# Patient Record
Sex: Female | Born: 1960 | Race: White | Hispanic: No | Smoking: Former smoker
Health system: Southern US, Community
[De-identification: ages and names within clinical notes are randomized; demographics above are authoritative.]

## PROBLEM LIST (undated history)

## (undated) ENCOUNTER — Emergency Department (HOSPITAL_COMMUNITY): Payer: BLUE CROSS/BLUE SHIELD | Source: Home / Self Care

## (undated) HISTORY — PX: AUGMENTATION MAMMAPLASTY: SUR837

---

## 2016-09-07 ENCOUNTER — Ambulatory Visit (INDEPENDENT_AMBULATORY_CARE_PROVIDER_SITE_OTHER): Payer: Self-pay | Admitting: Nurse Practitioner

## 2016-09-07 ENCOUNTER — Encounter: Payer: Self-pay | Admitting: Nurse Practitioner

## 2016-09-07 VITALS — BP 100/68 | HR 78 | Temp 97.9°F | Ht 64.0 in | Wt 165.8 lb

## 2016-09-07 DIAGNOSIS — J209 Acute bronchitis, unspecified: Secondary | ICD-10-CM

## 2016-09-07 MED ORDER — HYDROCODONE-HOMATROPINE 5-1.5 MG/5ML PO SYRP
5.0000 mL | ORAL_SOLUTION | Freq: Four times a day (QID) | ORAL | 0 refills | Status: DC | PRN
Start: 2016-09-07 — End: 2017-12-31

## 2016-09-07 MED ORDER — PREDNISONE 20 MG PO TABS: ORAL_TABLET | ORAL | 0 refills | Status: DC

## 2016-09-07 MED ORDER — LEVOFLOXACIN 500 MG PO TABS: 500.0000 mg | ORAL_TABLET | Freq: Every day | ORAL | 0 refills | Status: AC

## 2016-09-07 NOTE — Progress Notes (Signed)
Subjective:     Mary Gardner is a 56 y.o. female here for evaluation of a cough. Onset of symptoms was 1 week ago. Symptoms have been unchanged since that time. The cough is harsh and productive of green sputum and is aggravated by  worse at noght. Associated symptoms include: change in voice, sputum production and headache. Patient does not have a history of asthma. Patient does not have a history of environmental allergens. Patient has not traveled recently. Patient does not have a history of smoking. Patient has had a previous chest x-ray. Patient has not had a PPD done.  The following portions of the patient's history were reviewed and updated as appropriate: allergies, current medications, past family history, past medical history, past social history, past surgical history and problem list.  Review of Systems Pertinent items noted in HPI and remainder of comprehensive ROS otherwise negative.    Objective:     BP 100/68   Pulse 78   Temp 97.9 F (36.6 C)   Wt 165 lb 12.8 oz (75.2 kg)   SpO2 98%  General appearance: alert, cooperative and mild distress Eyes: conjunctivae/corneas clear. PERRL, EOM's intact. Fundi benign. Ears: normal TM's and external ear canals both ears Nose: green discharge, moderate congestion, turbinates red Throat: lips, mucosa, and tongue normal; teeth and gums normal Neck: no adenopathy, no carotid bruit, no JVD, supple, symmetrical, trachea midline and thyroid not enlarged, symmetric, no tenderness/mass/nodules Lungs: rhonchi bibasilar and posterior - bilateral and deep cough Heart: regular rate and rhythm, S1, S2 normal, no murmur, click, rub or gallop    Assessment:    Acute Bronchitis    Plan:   1. Acute bronchitis, unspecified organism    Meds ordered this encounter  Medications  . levofloxacin (LEVAQUIN) 500 MG tablet    Sig: Take 1 tablet (500 mg total) by mouth daily.    Dispense:  10 tablet    Refill:  0    Order Specific Question:    Supervising Provider    Answer:   Stacie GlazeJENKINS, JOHN E [[id number]]  . HYDROcodone-homatropine (HYCODAN) 5-1.5 MG/5ML syrup    Sig: Take 5 mLs by mouth every 6 (six) hours as needed for cough.    Dispense:  120 mL    Refill:  0    Order Specific Question:   Supervising Provider    Answer:   Stacie GlazeJENKINS, JOHN E [[id number]]  . predniSONE (DELTASONE) 20 MG tablet    Sig: 2 po at sametime daily for 5 days    Dispense:  10 tablet    Refill:  0    Order Specific Question:   Supervising Provider    Answer:   Darryll CapersJENKINS, JOHN E [[id number]]   1. Take meds as prescribed 2. Use a cool mist humidifier especially during the winter months and when heat has been humid. 3. Use saline nose sprays frequently 4. Saline irrigations of the nose can be very helpful if done frequently.  * 4X daily for 1 week*  * Use of a nettie pot can be helpful with this. Follow directions with this* 5. Drink plenty of fluids 6. Keep thermostat turn down low 7.For any cough or congestion  Use plain Mucinex- regular strength or max strength is fine   * Children- consult with Pharmacist for dosing 8. For fever or aces or pains- take tylenol or ibuprofen appropriate for age and weight.  * for fevers greater than 101 orally you may alternate ibuprofen and tylenol every  3 hours.  Mary-Margaret Hassell Done, FNP

## 2016-09-07 NOTE — Patient Instructions (Signed)

## 2017-01-02 ENCOUNTER — Other Ambulatory Visit: Payer: Self-pay | Admitting: Family Medicine

## 2017-01-05 ENCOUNTER — Other Ambulatory Visit: Payer: Self-pay | Admitting: Family Medicine

## 2017-01-05 DIAGNOSIS — Z1231 Encounter for screening mammogram for malignant neoplasm of breast: Secondary | ICD-10-CM

## 2017-01-14 ENCOUNTER — Other Ambulatory Visit: Payer: Self-pay | Admitting: Obstetrics and Gynecology

## 2017-01-14 DIAGNOSIS — Z1231 Encounter for screening mammogram for malignant neoplasm of breast: Secondary | ICD-10-CM

## 2017-01-29 ENCOUNTER — Ambulatory Visit (HOSPITAL_COMMUNITY)
Admission: RE | Admit: 2017-01-29 | Discharge: 2017-01-29 | Disposition: A | Discharge status: Home / Self Care | Payer: Self-pay | Source: Ambulatory Visit | Attending: Obstetrics and Gynecology | Admitting: Obstetrics and Gynecology

## 2017-01-29 ENCOUNTER — Other Ambulatory Visit: Payer: Self-pay | Admitting: Obstetrics and Gynecology

## 2017-01-29 ENCOUNTER — Encounter (HOSPITAL_COMMUNITY): Payer: Self-pay

## 2017-01-29 ENCOUNTER — Encounter: Payer: Self-pay | Admitting: Radiology

## 2017-01-29 ENCOUNTER — Ambulatory Visit
Admission: RE | Admit: 2017-01-29 | Discharge: 2017-01-29 | Disposition: A | Discharge status: Home / Self Care | Payer: No Typology Code available for payment source | Source: Ambulatory Visit | Attending: Obstetrics and Gynecology | Admitting: Obstetrics and Gynecology

## 2017-01-29 VITALS — BP 135/85 | HR 66 | Temp 98.1°F | Ht 64.0 in | Wt 165.5 lb

## 2017-01-29 DIAGNOSIS — Z1231 Encounter for screening mammogram for malignant neoplasm of breast: Secondary | ICD-10-CM

## 2017-01-29 DIAGNOSIS — Z1239 Encounter for other screening for malignant neoplasm of breast: Secondary | ICD-10-CM

## 2017-01-29 NOTE — Progress Notes (Signed)
No complaints today.   Pap Smear: Pap smear not completed today. Last Pap smear was in 2008 and normal per patient. Per patient has no history of an abnormal Pap smear. Patient has a history of a hysterectomy in 2008 for fibroids and painful cramps. Patient no longer needs Pap smears due to her history of a hysterectomy for benign reasons. No Pap smear results are in EPIC.  Physical exam: Breasts Breasts symmetrical. No skin abnormalities bilateral breasts. No nipple retraction bilateral breasts. No nipple discharge bilateral breasts. No lymphadenopathy. No lumps palpated bilateral breasts. No complaints of pain or tenderness on exam. [facility]. Appointment scheduled for Thursday, January 29, 2017 at 1600.        Pelvic/Bimanual No Pap smear completed today since patient has a history of a hysterectomy for benign reasons. Pap smear not indicated per [facility] guidelines.   Smoking History: Patient is a former smoker that quit in the early 1980's.  Patient Navigation: Patient education provided. Access to services provided for patient through [facility] program.   Colorectal Cancer Screening: Per patient has never had a colonoscopy completed. No complaints today. FIT Test given to patient to complete and return to [facility].

## 2017-01-29 NOTE — Patient Instructions (Signed)
Explained breast self awareness with Eloise LevelsAlecia Goers. Patient did not need a Pap smear today due to her history of a hysterectomy for benign reasons. Let patient know that she doesn't need any further Pap smears due to her history of a hysterectomy for benign reasons. [facility]. Appointment scheduled for Thursday, January 29, 2017 at 1600. Let patient know the Breast Center will follow up with her within the next couple weeks with results of mammogram by letter or phone. Gladstone LighterAlecia Jividen verbalized understanding.  Sallyann Kinnaird, Kathaleen Maserhristine Poll, RN 3:43 PM

## 2017-02-02 ENCOUNTER — Ambulatory Visit: Payer: Self-pay

## 2017-02-03 ENCOUNTER — Encounter (HOSPITAL_COMMUNITY): Payer: Self-pay

## 2017-05-28 ENCOUNTER — Ambulatory Visit: Payer: Self-pay | Admitting: Nurse Practitioner

## 2017-05-28 ENCOUNTER — Encounter: Payer: Self-pay | Admitting: Nurse Practitioner

## 2017-05-28 VITALS — BP 104/72 | HR 82 | Temp 98.4°F | Resp 18 | Wt 169.0 lb

## 2017-05-28 DIAGNOSIS — J209 Acute bronchitis, unspecified: Secondary | ICD-10-CM

## 2017-05-28 MED ORDER — BENZONATATE 100 MG PO CAPS: 100.0000 mg | ORAL_CAPSULE | Freq: Three times a day (TID) | ORAL | 0 refills | Status: AC | PRN

## 2017-05-28 MED ORDER — HYDROCODONE-HOMATROPINE 5-1.5 MG/5ML PO SYRP: 5.0000 mL | ORAL_SOLUTION | Freq: Three times a day (TID) | ORAL | 0 refills | Status: DC | PRN

## 2017-05-28 MED ORDER — PREDNISONE 10 MG (21) PO TBPK: ORAL_TABLET | ORAL | 0 refills | Status: AC

## 2017-05-28 MED ORDER — LEVOFLOXACIN 500 MG PO TABS: 500.0000 mg | ORAL_TABLET | Freq: Every day | ORAL | 0 refills | Status: AC

## 2017-05-28 NOTE — Patient Instructions (Addendum)

## 2017-05-28 NOTE — Progress Notes (Signed)
Subjective:     Mary Gardner is a 56 y.o. female here for evaluation of a cough. Onset of symptoms was 3 days ago. Symptoms have been gradually worsening since that time. The cough is painful and productive of clear sputum and is aggravated by nothing. Associated symptoms include: chest congestion. 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 Patient does have a history of environmental allergens. Patient has not traveled recently. Patient does have a history of smoking marijuana.    Patient states she had the same symptoms earlier this year and the medication regimen prescribed worked for her symptoms.    The following portions of the patient's history were reviewed and updated as appropriate: allergies, current medications and past medical history.  Review of Systems Constitutional: negative Eyes: negative Ears, nose, mouth, throat, and face: negative Respiratory: positive for cough, sputum and bronchitis, negative for hemoptysis Cardiovascular: negative Neurological: positive for headaches Allergic/Immunologic: positive for hay fever    Objective:     BP 104/72 (BP Location: Right Arm, Patient Position: Sitting, Cuff Size: Normal)   Pulse 82   Temp 98.4 F (36.9 C) (Oral)   Resp 18   Wt 169 lb (76.7 kg)   SpO2 100%   BMI 29.01 kg/m  General appearance: alert, cooperative, mild distress and due to excessive coughing Head: Normocephalic, without obvious abnormality, atraumatic Eyes: conjunctivae/corneas clear. PERRL, EOM's intact. Fundi benign. Ears: normal TM's and external ear canals both ears Nose: Nares normal. Septum midline. Mucosa normal. No drainage or sinus tenderness. Throat: lips, mucosa, and tongue normal; teeth and gums normal Lungs: rhonchi posterior - bilateral Heart: regular rate and rhythm, S1, S2 normal, no murmur, click, rub or gallop Pulses: 2+ and symmetric Skin: Skin color, texture, turgor  normal. No rashes or lesions Lymph nodes: Cervical, supraclavicular, and axillary nodes normal. Neurologic: Grossly normal    Assessment:    Acute Bronchitis    Plan:      Antitussives and antibiotic per medication orders.   Sterapred as directed.  Tessalon perles for cough during the day.  Hycodan syrup for more severe cough.  Avoid exposure to tobacco smoke and fumes. Patient instructed to use a humidifier at home and to avoid cool, dry air.  Patient also instructed to use honey as needed for cough Call if shortness of breath worsens, blood in sputum, change in character of cough, development of fever or chills, inability to maintain nutrition and hydration. Avoid exposure to tobacco smoke and fumes.  Reviewed with patient instruction to go to ER if chest pain changes in nature, SOB, difficulty breathing, pain radiating down arm or onset of diaphoresis.  Patient verbalized understanding.

## 2017-06-02 ENCOUNTER — Telehealth: Payer: Self-pay | Admitting: Nurse Practitioner

## 2017-06-02 NOTE — Telephone Encounter (Signed)
Called patient to follow up on her status.  Unable to leave voicemail.

## 2017-06-10 ENCOUNTER — Ambulatory Visit: Payer: Self-pay | Admitting: Emergency Medicine

## 2017-06-10 ENCOUNTER — Encounter: Payer: Self-pay | Admitting: Emergency Medicine

## 2017-06-10 DIAGNOSIS — J0120 Acute ethmoidal sinusitis, unspecified: Secondary | ICD-10-CM

## 2017-06-10 DIAGNOSIS — T148XXA Other injury of unspecified body region, initial encounter: Secondary | ICD-10-CM

## 2017-06-10 MED ORDER — METHOCARBAMOL 500 MG PO TABS: 500.0000 mg | ORAL_TABLET | Freq: Four times a day (QID) | ORAL | 0 refills | Status: DC | PRN

## 2017-06-10 MED ORDER — AMOXICILLIN 500 MG PO CAPS: 1000.0000 mg | ORAL_CAPSULE | Freq: Two times a day (BID) | ORAL | 0 refills | Status: AC

## 2017-06-10 MED ORDER — FLUTICASONE PROPIONATE 50 MCG/ACT NA SUSP: 2.0000 | Freq: Two times a day (BID) | NASAL | 0 refills | Status: DC

## 2017-06-10 MED ORDER — DICLOFENAC SODIUM 75 MG PO TBEC: 75.0000 mg | DELAYED_RELEASE_TABLET | Freq: Two times a day (BID) | ORAL | 0 refills | Status: DC
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
## 2017-06-25 DIAGNOSIS — M79645 Pain in left finger(s): Secondary | ICD-10-CM | POA: Diagnosis not present

## 2017-06-25 DIAGNOSIS — S63633A Sprain of interphalangeal joint of left middle finger, initial encounter: Secondary | ICD-10-CM | POA: Diagnosis not present

## 2017-06-25 DIAGNOSIS — M13842 Other specified arthritis, left hand: Secondary | ICD-10-CM | POA: Diagnosis not present

## 2017-07-15 DIAGNOSIS — F12180 Cannabis abuse with cannabis-induced anxiety disorder: Secondary | ICD-10-CM | POA: Diagnosis not present

## 2017-07-15 DIAGNOSIS — R51 Headache: Secondary | ICD-10-CM | POA: Diagnosis not present

## 2017-07-15 DIAGNOSIS — R1013 Epigastric pain: Secondary | ICD-10-CM | POA: Diagnosis not present

## 2017-07-15 DIAGNOSIS — F3340 Major depressive disorder, recurrent, in remission, unspecified: Secondary | ICD-10-CM | POA: Diagnosis not present

## 2017-07-20 ENCOUNTER — Other Ambulatory Visit: Payer: Self-pay | Admitting: Family Medicine

## 2017-07-20 DIAGNOSIS — R519 Headache, unspecified: Secondary | ICD-10-CM

## 2017-07-20 DIAGNOSIS — R51 Headache: Principal | ICD-10-CM

## 2017-07-27 DIAGNOSIS — S63633D Sprain of interphalangeal joint of left middle finger, subsequent encounter: Secondary | ICD-10-CM | POA: Diagnosis not present

## 2017-07-27 DIAGNOSIS — M13842 Other specified arthritis, left hand: Secondary | ICD-10-CM | POA: Diagnosis not present

## 2017-07-27 DIAGNOSIS — M79642 Pain in left hand: Secondary | ICD-10-CM | POA: Diagnosis not present

## 2017-08-03 ENCOUNTER — Ambulatory Visit
Admission: RE | Admit: 2017-08-03 | Discharge: 2017-08-03 | Disposition: A | Discharge status: Home / Self Care | Payer: BLUE CROSS/BLUE SHIELD | Source: Ambulatory Visit | Attending: Family Medicine | Admitting: Family Medicine

## 2017-08-03 DIAGNOSIS — R51 Headache: Secondary | ICD-10-CM | POA: Diagnosis not present

## 2017-08-03 DIAGNOSIS — R519 Headache, unspecified: Secondary | ICD-10-CM

## 2017-08-04 DIAGNOSIS — N951 Menopausal and female climacteric states: Secondary | ICD-10-CM | POA: Diagnosis not present

## 2017-08-04 DIAGNOSIS — F324 Major depressive disorder, single episode, in partial remission: Secondary | ICD-10-CM | POA: Diagnosis not present

## 2017-08-04 DIAGNOSIS — Z0000 Encounter for general adult medical examination without abnormal findings: Secondary | ICD-10-CM | POA: Diagnosis not present

## 2017-08-04 DIAGNOSIS — E039 Hypothyroidism, unspecified: Secondary | ICD-10-CM | POA: Diagnosis not present

## 2017-08-17 DIAGNOSIS — Z01818 Encounter for other preprocedural examination: Secondary | ICD-10-CM | POA: Diagnosis not present

## 2017-08-17 DIAGNOSIS — Z1211 Encounter for screening for malignant neoplasm of colon: Secondary | ICD-10-CM | POA: Diagnosis not present

## 2017-08-31 DIAGNOSIS — H25813 Combined forms of age-related cataract, bilateral: Secondary | ICD-10-CM | POA: Diagnosis not present

## 2017-09-15 DIAGNOSIS — R51 Headache: Secondary | ICD-10-CM | POA: Diagnosis not present

## 2017-10-12 DIAGNOSIS — F3340 Major depressive disorder, recurrent, in remission, unspecified: Secondary | ICD-10-CM | POA: Diagnosis not present

## 2017-10-12 DIAGNOSIS — F12180 Cannabis abuse with cannabis-induced anxiety disorder: Secondary | ICD-10-CM | POA: Diagnosis not present

## 2017-10-26 DIAGNOSIS — D126 Benign neoplasm of colon, unspecified: Secondary | ICD-10-CM | POA: Diagnosis not present

## 2017-10-26 DIAGNOSIS — Z1211 Encounter for screening for malignant neoplasm of colon: Secondary | ICD-10-CM | POA: Diagnosis not present

## 2017-10-28 DIAGNOSIS — D126 Benign neoplasm of colon, unspecified: Secondary | ICD-10-CM | POA: Diagnosis not present

## 2017-10-28 DIAGNOSIS — Z1211 Encounter for screening for malignant neoplasm of colon: Secondary | ICD-10-CM | POA: Diagnosis not present

## 2017-11-23 ENCOUNTER — Ambulatory Visit: Payer: BLUE CROSS/BLUE SHIELD | Admitting: Neurology

## 2017-12-31 ENCOUNTER — Encounter: Payer: Self-pay | Admitting: Podiatry

## 2017-12-31 ENCOUNTER — Ambulatory Visit: Payer: BLUE CROSS/BLUE SHIELD | Admitting: Podiatry

## 2017-12-31 ENCOUNTER — Ambulatory Visit: Payer: Self-pay

## 2017-12-31 VITALS — BP 108/61 | HR 68 | Temp 98.6°F | Ht 64.0 in | Wt 175.0 lb

## 2017-12-31 DIAGNOSIS — M79672 Pain in left foot: Secondary | ICD-10-CM | POA: Diagnosis not present

## 2017-12-31 DIAGNOSIS — B070 Plantar wart: Secondary | ICD-10-CM | POA: Diagnosis not present

## 2017-12-31 NOTE — Progress Notes (Signed)
   Subjective:    Patient ID: Mary Gardner, female    DOB: 1960/07/07, 57 y.o.   MRN: [id number]  HPI    Review of Systems  All other systems reviewed and are negative.      Objective:   Physical Exam        Assessment & Plan:

## 2018-01-04 NOTE — Progress Notes (Signed)
Subjective:   Patient ID: Mary Gardner, female   DOB: 57 y.o.   MRN: [id number]   HPI Patient presents stating I have got this painful spot on the bottom of my left foot that is been present for several months and I do not remember stepping on anything or any bleeding or foreign body penetration.  Patient states is gotten gradually worse over that time and patient does not smoke and likes to be active   Review of Systems  All other systems reviewed and are negative.       Objective:  Physical Exam  Constitutional: She appears well-developed and well-nourished.  Cardiovascular: Intact distal pulses.  Pulmonary/Chest: Effort normal.  Musculoskeletal: Normal range of motion.  Neurological: She is alert.  Skin: Skin is warm.  Nursing note and vitals reviewed.   Neurovascular status intact muscle strength adequate range of motion within normal limits with patient noted to have lesion plantar aspect left foot that is painful when pressed and making walking difficult.  Upon debridement is a small amount of pinpoint bleeding and it is painful to both direct and lateral pressure     Assessment:  Possibility for verruca plantaris versus possibility for porokeratotic callus type lesion plantar left     Plan:  H&P condition reviewed and sterile aggressive debridement of lesion accomplished with no iatrogenic bleeding and I then went ahead and applied chemical agent to create immune response with sterile dressing and instructed what to do if it should blister.  Reappoint in 4 weeks for recheck or earlier if any issues should occur

## 2018-01-18 DIAGNOSIS — F3340 Major depressive disorder, recurrent, in remission, unspecified: Secondary | ICD-10-CM | POA: Diagnosis not present

## 2018-01-18 DIAGNOSIS — F12180 Cannabis abuse with cannabis-induced anxiety disorder: Secondary | ICD-10-CM | POA: Diagnosis not present

## 2018-01-18 DIAGNOSIS — L814 Other melanin hyperpigmentation: Secondary | ICD-10-CM | POA: Diagnosis not present

## 2018-01-18 DIAGNOSIS — C44729 Squamous cell carcinoma of skin of left lower limb, including hip: Secondary | ICD-10-CM | POA: Diagnosis not present

## 2018-01-18 DIAGNOSIS — D225 Melanocytic nevi of trunk: Secondary | ICD-10-CM | POA: Diagnosis not present

## 2018-01-18 DIAGNOSIS — L821 Other seborrheic keratosis: Secondary | ICD-10-CM | POA: Diagnosis not present

## 2018-02-11 DIAGNOSIS — C44729 Squamous cell carcinoma of skin of left lower limb, including hip: Secondary | ICD-10-CM | POA: Diagnosis not present

## 2018-04-19 DIAGNOSIS — F3340 Major depressive disorder, recurrent, in remission, unspecified: Secondary | ICD-10-CM | POA: Diagnosis not present

## 2018-04-19 DIAGNOSIS — F900 Attention-deficit hyperactivity disorder, predominantly inattentive type: Secondary | ICD-10-CM | POA: Diagnosis not present

## 2018-04-19 DIAGNOSIS — F12180 Cannabis abuse with cannabis-induced anxiety disorder: Secondary | ICD-10-CM | POA: Diagnosis not present

## 2018-05-12 DIAGNOSIS — F3340 Major depressive disorder, recurrent, in remission, unspecified: Secondary | ICD-10-CM | POA: Diagnosis not present

## 2018-05-12 DIAGNOSIS — Z79899 Other long term (current) drug therapy: Secondary | ICD-10-CM | POA: Diagnosis not present

## 2018-05-24 DIAGNOSIS — F12180 Cannabis abuse with cannabis-induced anxiety disorder: Secondary | ICD-10-CM | POA: Diagnosis not present

## 2018-05-24 DIAGNOSIS — F3340 Major depressive disorder, recurrent, in remission, unspecified: Secondary | ICD-10-CM | POA: Diagnosis not present

## 2018-05-24 DIAGNOSIS — F900 Attention-deficit hyperactivity disorder, predominantly inattentive type: Secondary | ICD-10-CM | POA: Diagnosis not present

## 2018-07-26 DIAGNOSIS — F1210 Cannabis abuse, uncomplicated: Secondary | ICD-10-CM | POA: Diagnosis not present

## 2018-07-26 DIAGNOSIS — F3181 Bipolar II disorder: Secondary | ICD-10-CM | POA: Diagnosis not present

## 2018-07-26 DIAGNOSIS — F900 Attention-deficit hyperactivity disorder, predominantly inattentive type: Secondary | ICD-10-CM | POA: Diagnosis not present

## 2018-09-06 DIAGNOSIS — F1210 Cannabis abuse, uncomplicated: Secondary | ICD-10-CM | POA: Diagnosis not present

## 2018-09-06 DIAGNOSIS — F900 Attention-deficit hyperactivity disorder, predominantly inattentive type: Secondary | ICD-10-CM | POA: Diagnosis not present

## 2018-09-06 DIAGNOSIS — F3181 Bipolar II disorder: Secondary | ICD-10-CM | POA: Diagnosis not present

## 2018-10-12 DIAGNOSIS — F900 Attention-deficit hyperactivity disorder, predominantly inattentive type: Secondary | ICD-10-CM | POA: Diagnosis not present

## 2018-10-12 DIAGNOSIS — F3181 Bipolar II disorder: Secondary | ICD-10-CM | POA: Diagnosis not present

## 2018-10-12 DIAGNOSIS — F1210 Cannabis abuse, uncomplicated: Secondary | ICD-10-CM | POA: Diagnosis not present

## 2018-11-01 DIAGNOSIS — E039 Hypothyroidism, unspecified: Secondary | ICD-10-CM | POA: Diagnosis not present

## 2018-11-08 DIAGNOSIS — F900 Attention-deficit hyperactivity disorder, predominantly inattentive type: Secondary | ICD-10-CM | POA: Diagnosis not present

## 2018-11-08 DIAGNOSIS — F3181 Bipolar II disorder: Secondary | ICD-10-CM | POA: Diagnosis not present

## 2018-11-08 DIAGNOSIS — F1210 Cannabis abuse, uncomplicated: Secondary | ICD-10-CM | POA: Diagnosis not present

## 2018-11-29 DIAGNOSIS — H04123 Dry eye syndrome of bilateral lacrimal glands: Secondary | ICD-10-CM | POA: Diagnosis not present

## 2018-12-06 DIAGNOSIS — F900 Attention-deficit hyperactivity disorder, predominantly inattentive type: Secondary | ICD-10-CM | POA: Diagnosis not present

## 2018-12-06 DIAGNOSIS — F3181 Bipolar II disorder: Secondary | ICD-10-CM | POA: Diagnosis not present

## 2018-12-06 DIAGNOSIS — F1210 Cannabis abuse, uncomplicated: Secondary | ICD-10-CM | POA: Diagnosis not present

## 2018-12-20 DIAGNOSIS — F1210 Cannabis abuse, uncomplicated: Secondary | ICD-10-CM | POA: Diagnosis not present

## 2018-12-20 DIAGNOSIS — F3181 Bipolar II disorder: Secondary | ICD-10-CM | POA: Diagnosis not present

## 2018-12-20 DIAGNOSIS — F900 Attention-deficit hyperactivity disorder, predominantly inattentive type: Secondary | ICD-10-CM | POA: Diagnosis not present

## 2019-01-24 DIAGNOSIS — F3181 Bipolar II disorder: Secondary | ICD-10-CM | POA: Diagnosis not present

## 2019-01-24 DIAGNOSIS — F1210 Cannabis abuse, uncomplicated: Secondary | ICD-10-CM | POA: Diagnosis not present

## 2019-01-24 DIAGNOSIS — F900 Attention-deficit hyperactivity disorder, predominantly inattentive type: Secondary | ICD-10-CM | POA: Diagnosis not present

## 2019-01-31 DIAGNOSIS — Z0000 Encounter for general adult medical examination without abnormal findings: Secondary | ICD-10-CM | POA: Diagnosis not present

## 2019-02-07 ENCOUNTER — Other Ambulatory Visit: Payer: Self-pay | Admitting: Family Medicine

## 2019-02-07 DIAGNOSIS — Z1231 Encounter for screening mammogram for malignant neoplasm of breast: Secondary | ICD-10-CM

## 2019-02-14 ENCOUNTER — Encounter: Payer: Self-pay | Admitting: Podiatry

## 2019-02-14 ENCOUNTER — Other Ambulatory Visit: Payer: Self-pay

## 2019-02-14 ENCOUNTER — Ambulatory Visit: Payer: BC Managed Care – PPO | Admitting: Podiatry

## 2019-02-14 DIAGNOSIS — B070 Plantar wart: Secondary | ICD-10-CM | POA: Diagnosis not present

## 2019-02-14 NOTE — Progress Notes (Signed)
Subjective:   Patient ID: Mary Gardner, female   DOB: 58 y.o.   MRN: [id number]   HPI Patient states this lesion has come back on my left foot and it is gotten painful and it was better for around 9 months and seemed to react well to the chemical   ROS      Objective:  Physical Exam  Neurovascular status intact with lesion sub-third metatarsal and proximal left that upon debridement shows pinpoint bleeding and pain to lateral pressure     Assessment:  Probability for verruca plantaris left     Plan:  H&P condition reviewed and at this time I did deep debridement of the lesion and applied chemical agent to create immune response with sterile dressing.  Instructed what to do if any blistering were to occur reappoint as needed

## 2019-02-21 DIAGNOSIS — F1210 Cannabis abuse, uncomplicated: Secondary | ICD-10-CM | POA: Diagnosis not present

## 2019-02-21 DIAGNOSIS — F900 Attention-deficit hyperactivity disorder, predominantly inattentive type: Secondary | ICD-10-CM | POA: Diagnosis not present

## 2019-02-21 DIAGNOSIS — F3181 Bipolar II disorder: Secondary | ICD-10-CM | POA: Diagnosis not present

## 2019-03-11 DIAGNOSIS — Z20828 Contact with and (suspected) exposure to other viral communicable diseases: Secondary | ICD-10-CM | POA: Diagnosis not present

## 2019-03-15 DIAGNOSIS — F1210 Cannabis abuse, uncomplicated: Secondary | ICD-10-CM | POA: Diagnosis not present

## 2019-03-15 DIAGNOSIS — F900 Attention-deficit hyperactivity disorder, predominantly inattentive type: Secondary | ICD-10-CM | POA: Diagnosis not present

## 2019-03-15 DIAGNOSIS — F3181 Bipolar II disorder: Secondary | ICD-10-CM | POA: Diagnosis not present

## 2019-03-16 ENCOUNTER — Other Ambulatory Visit: Payer: Self-pay

## 2019-03-16 ENCOUNTER — Ambulatory Visit
Admission: RE | Admit: 2019-03-16 | Discharge: 2019-03-16 | Disposition: A | Discharge status: Home / Self Care | Payer: BLUE CROSS/BLUE SHIELD | Source: Ambulatory Visit | Attending: Family Medicine | Admitting: Family Medicine

## 2019-03-16 DIAGNOSIS — Z1231 Encounter for screening mammogram for malignant neoplasm of breast: Secondary | ICD-10-CM | POA: Diagnosis not present

## 2020-06-09 IMAGING — [facility]  DIGITAL SCREENING BREAST BILAT IMPLANT W/ TOMO W/ CAD
8 of 12 series · 8 of 28 positions shown · non-contrast
Comparison: Previous exam(s).

CLINICAL DATA: Screening.

EXAM:
DIGITAL SCREENING BILATERAL MAMMOGRAM WITH IMPLANTS, CAD AND TOMO
The patient has retropectoral implants. Standard and implant
displaced views were performed.

[L MLO]
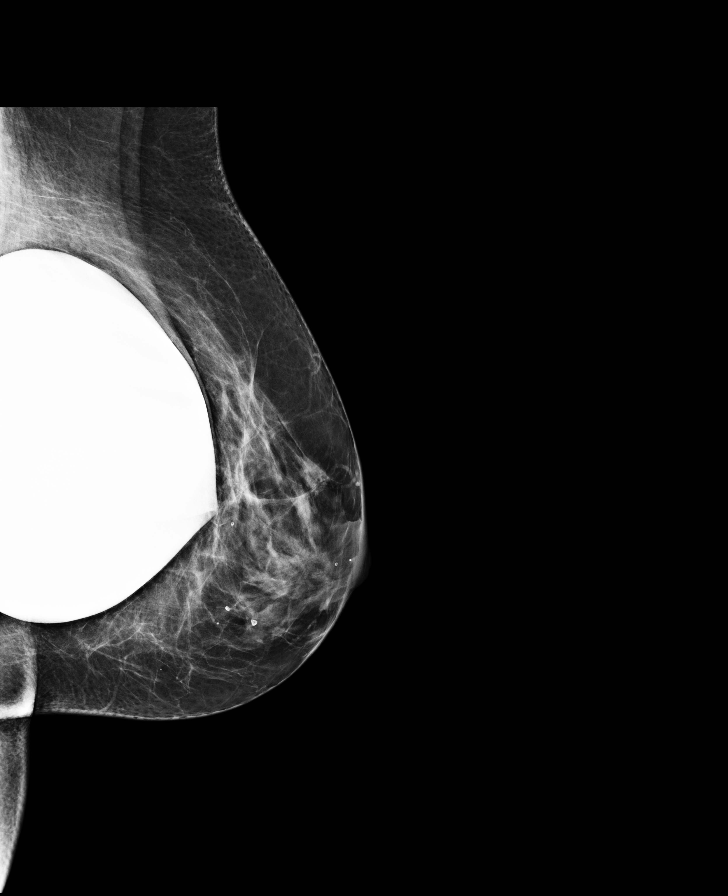

[R MLO]
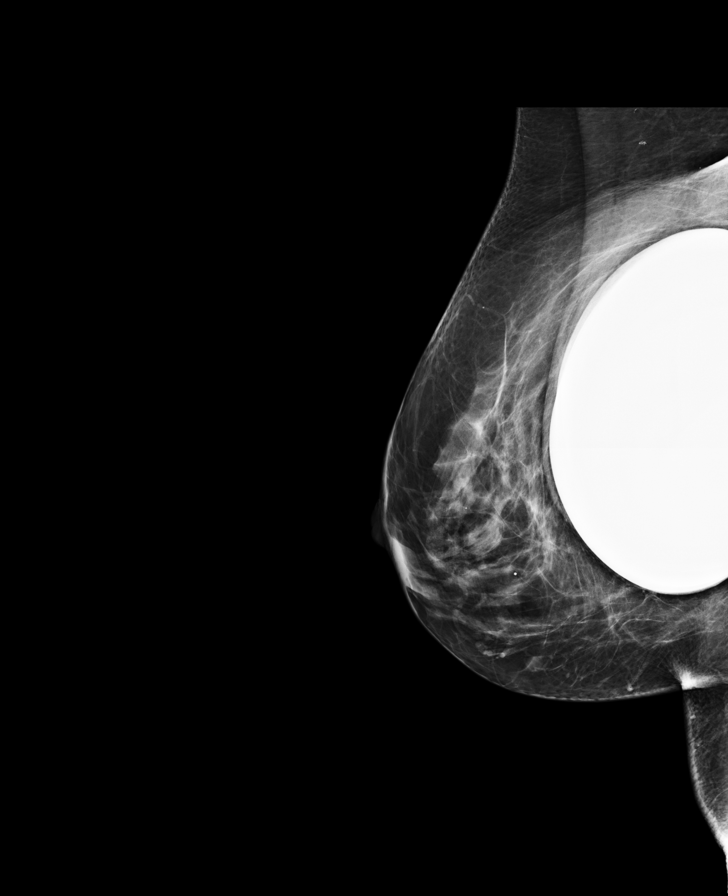

[L CC]
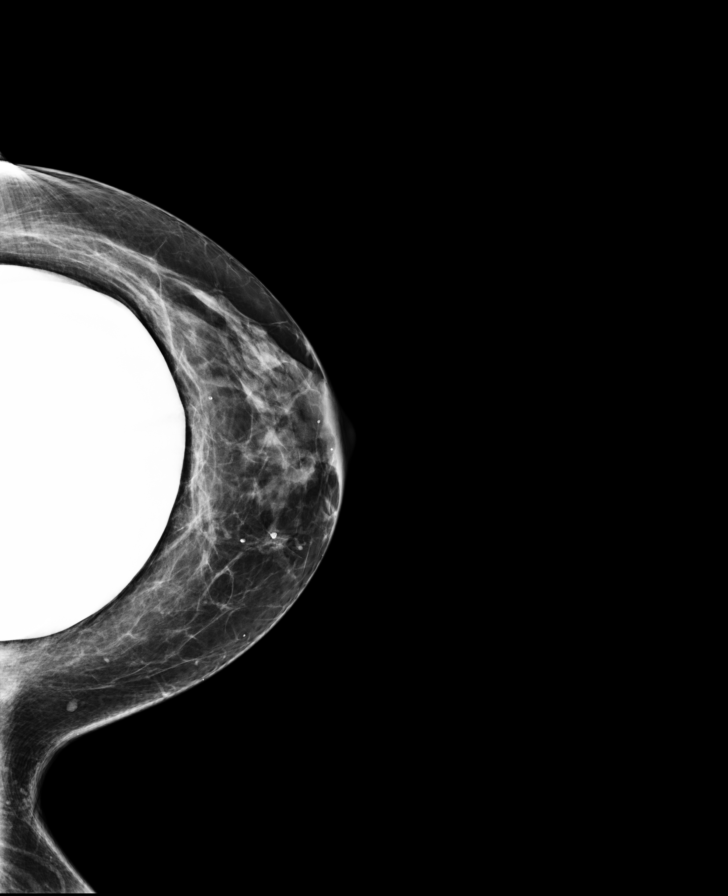

[R CC]
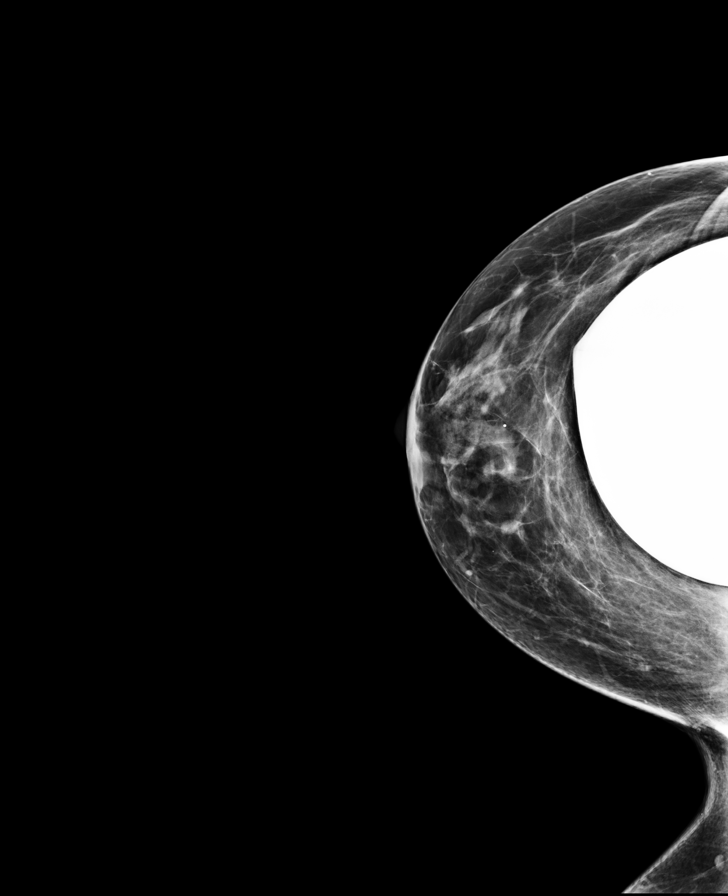

[R MLO synth-2D]
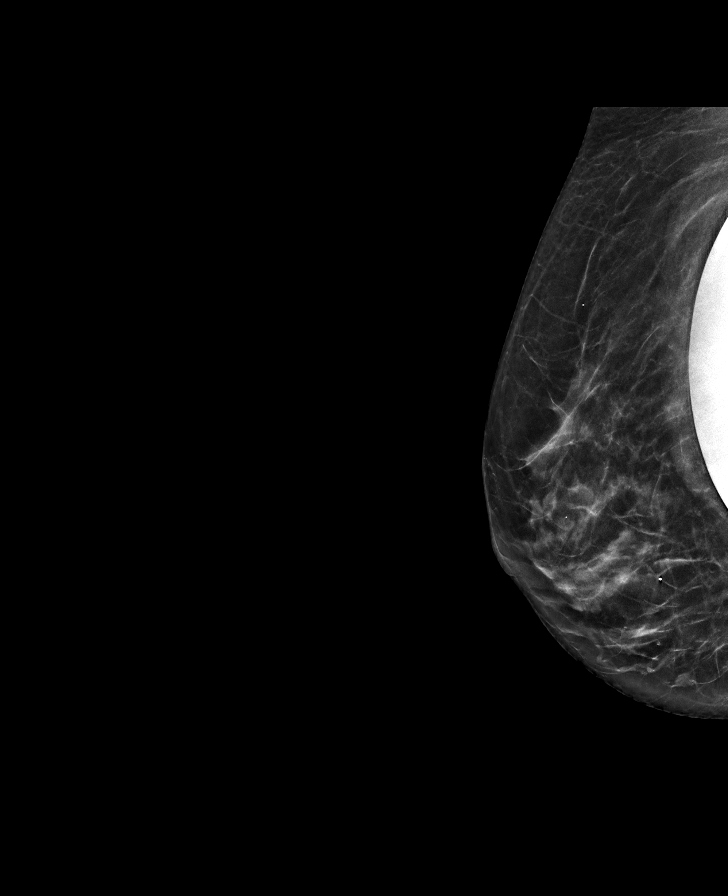

[L MLO synth-2D]
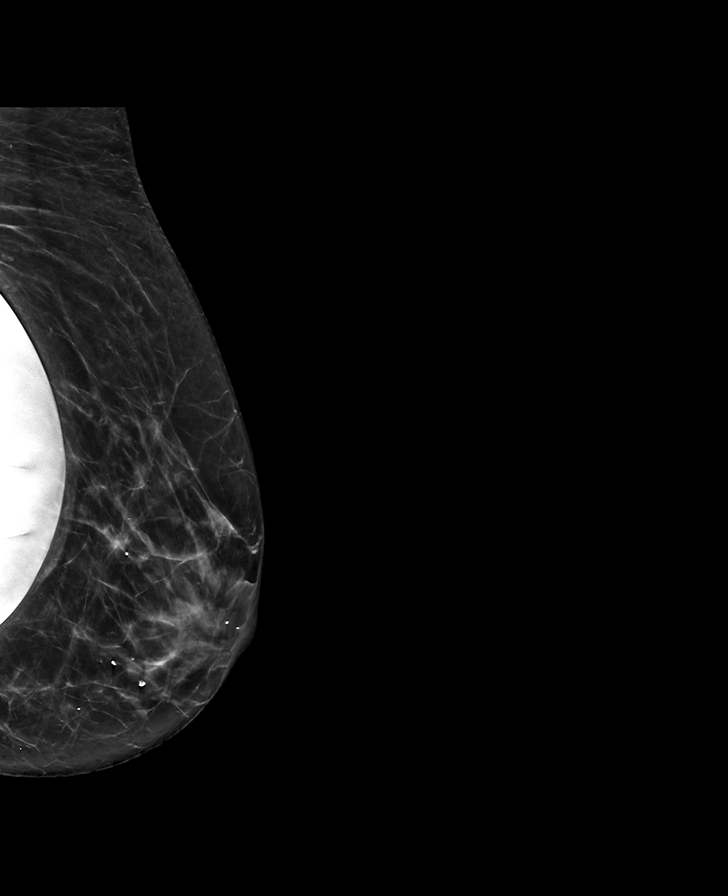

[L CC synth-2D]
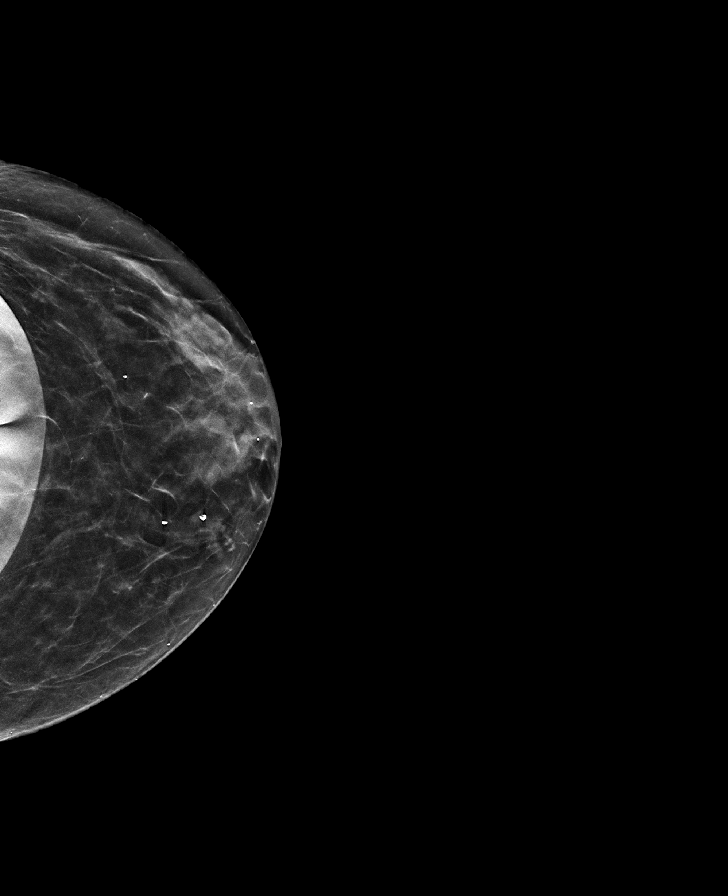

[R CC synth-2D]
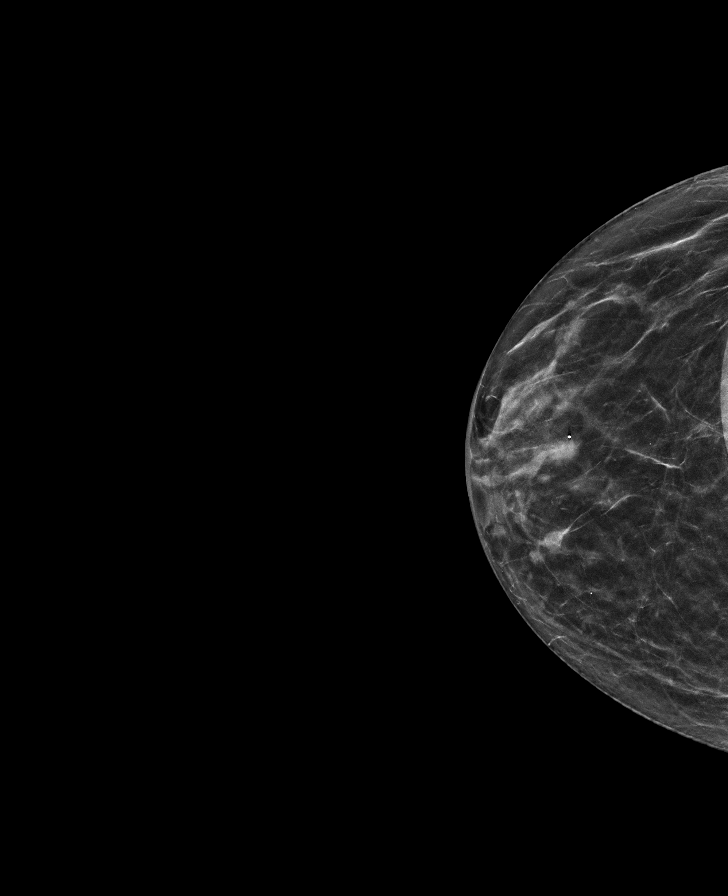

[8 of 28 positions shown; findings below may reference images not displayed]

ACR Breast Density Category c: The breast tissue is heterogeneously
dense, which may obscure small masses.
FINDINGS: There are no findings suspicious for malignancy. Images were
processed with CAD.
IMPRESSION: No mammographic evidence of malignancy. A result letter of this
screening mammogram will be mailed directly to the patient.

RECOMMENDATION:
Screening mammogram in one year. (Code:49-X-OQ9)

BI-RADS CATEGORY  1:  Negative.

## 2020-06-25 ENCOUNTER — Ambulatory Visit: Payer: BC Managed Care – PPO | Admitting: Podiatry
